# Patient Record
Sex: Male | Born: 1995 | Race: Black or African American | Hispanic: No | Marital: Single | Smoking: Never smoker
Health system: Southern US, Community
[De-identification: ages and names within clinical notes are randomized; demographics above are authoritative.]

---
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
## 2016-05-11 ENCOUNTER — Ambulatory Visit (HOSPITAL_BASED_OUTPATIENT_CLINIC_OR_DEPARTMENT_OTHER)
Admission: RE | Admit: 2016-05-11 | Discharge: 2016-05-11 | Disposition: A | Discharge status: Home / Self Care | Payer: Managed Care, Other (non HMO) | Source: Ambulatory Visit | Attending: Family Medicine | Admitting: Family Medicine

## 2016-05-11 ENCOUNTER — Ambulatory Visit (INDEPENDENT_AMBULATORY_CARE_PROVIDER_SITE_OTHER): Payer: Managed Care, Other (non HMO) | Admitting: Family Medicine

## 2016-05-11 ENCOUNTER — Encounter: Payer: Self-pay | Admitting: Family Medicine

## 2016-05-11 VITALS — BP 134/76 | HR 78 | Temp 98.3°F | Ht 73.0 in | Wt 335.8 lb

## 2016-05-11 DIAGNOSIS — X58XXXA Exposure to other specified factors, initial encounter: Secondary | ICD-10-CM | POA: Insufficient documentation

## 2016-05-11 DIAGNOSIS — Z23 Encounter for immunization: Secondary | ICD-10-CM | POA: Diagnosis not present

## 2016-05-11 DIAGNOSIS — S6991XA Unspecified injury of right wrist, hand and finger(s), initial encounter: Secondary | ICD-10-CM | POA: Insufficient documentation

## 2016-05-11 NOTE — Progress Notes (Signed)
Chief Complaint  Patient presents with  . Establish Care    Pt reports wrist pain x2 weeks, Notice wrist pain after playing basketball/ Pt reports not trying any medication or ice or heat to area for discomfort but notices the pain onbly when he puts pressure on it       New Patient Visit SUBJECTIVE: HPI: Brandon Hines is an 20 y.o.male who is being seen for establishing care.  Wrist pain R wrist pain, 2 weeks ago was playing basketball. Someone struck the thumb side of his wrist. He has been having pain since then, but only when lifting weights or carrying something. It is slightly improving, but feels weak to him. He has not been trying anything at home. Denies current pain, numbness, tingling, or bruising.   No Known Allergies  History reviewed. No pertinent past medical history. History reviewed. No pertinent surgical history. Social History   Social History  . Marital status: Single   Social History Main Topics  . Smoking status: Never Smoker  . Smokeless tobacco: Never Used  . Alcohol use No  . Drug use: No   Family History  Problem Relation Age of Onset  . Hypertension Father   . Hypertension Paternal Grandfather      Current Outpatient Prescriptions:  .  Albuterol Sulfate (PROAIR RESPICLICK) 108 (90 Base) MCG/ACT AEPB, Inhale 2 puffs into the lungs every 6 (six) hours as needed., Disp: 1 each, Rfl: 3  ROS Neuro: Denies numbness, tingling  MSK: +wrist pain   OBJECTIVE: BP 134/76 (BP Location: Right Arm, Patient Position: Sitting, Cuff Size: Large)   Pulse 78   Temp 98.3 F (36.8 C) (Oral)   Ht 6\' 1"  (1.854 m)   Wt (!) 335 lb 12.8 oz (152.3 kg)   SpO2 98%   BMI 44.30 kg/m   Constitutional: -  VS reviewed -  Well developed, well nourished, appears stated age -  No apparent distress  Psychiatric: -  Oriented to person, place, and time -  Memory intact -  Affect and mood normal -  Fluent conversation, good eye contact -  Judgment and insight age  appropriate  Cardiovascular: -  Brisk cap refill  Respiratory: -  Normal respiratory effort  Neurological:  -  Sensation intact to light touch on R hand -  No cerebellar signs  Musculoskeletal: -  No TTP -  No deformity or swelling -  5/5 strength in hand  Skin: -  No significant lesion on inspection -  Warm and dry to palpation -  No ecchymosis or erythema on R wrist   ASSESSMENT/PLAN: Wrist injury, right, initial encounter - Plan: DG Wrist Complete Right  Encounter for immunization - Plan: Flu Vaccine QUAD 36+ mos IM  XR shows no fx, dislocation or other abnormality. As it is improving and only bothers him while he performs certain movements, will have him avoid these movements. Could pre-treat with Tylenol. Stretches/exercises given for wrist strengthening. OT if he is still having issues. Patient should return in 1 year for CPE or prn. The patient voiced understanding and agreement to the plan.   Jilda Rocheicholas Paul [place], DO 05/11/16  9:22 AM

## 2016-05-11 NOTE — Progress Notes (Signed)
Pre visit review using our clinic review tool, if applicable. No additional management support is needed unless otherwise documented below in the visit note. 

## 2016-05-11 NOTE — Patient Instructions (Addendum)
These exercises may help you when beginning to rehabilitate your injury. Your symptoms may resolve with or without further involvement from your physician, physical therapist or athletic trainer. While completing these exercises, remember:   Restoring tissue flexibility helps normal motion to return to the joints. This allows healthier, less painful movement and activity.  An effective stretch should be held for at least 30 seconds.  A stretch should never be painful. You should only feel a gentle lengthening or release in the stretched tissue.  RANGE OF MOTION - Wrist Flexion, Active-Assisted  Extend your right / left elbow with your fingers pointing down.*  Gently pull the back of your hand towards you until you feel a gentle stretch on the top of your forearm.  Hold this position for 15-20 seconds. Repeat 2-3 times. Complete this exercise 1 times per day.  *If directed by your physician, physical therapist or athletic trainer, complete this stretch with your elbow bent rather than extended.  RANGE OF MOTION - Wrist Extension, Active-Assisted  Extend your right / left elbow and turn your palm upwards.*  Gently pull your palm/fingertips back so your wrist extends and your fingers point more toward the ground.  You should feel a gentle stretch on the inside of your forearm.  Hold this position for __________ seconds. Repeat __________ times. Complete this exercise __________ times per day. *If directed by your physician, physical therapist or athletic trainer, complete this stretch with your elbow bent, rather than extended.  RANGE OF MOTION - Supination, Active  Stand or sit with your elbows at your side. Bend your right / left elbow to 90 degrees.  Turn your palm upward until you feel a gentle stretch on the inside of your forearm.  Hold this position for __________ seconds. Slowly release and return to the starting position. Repeat __________ times. Complete this stretch  __________ times per day.   RANGE OF MOTION - Pronation, Active  Stand or sit with your elbows at your side. Bend your right / left elbow to 90 degrees.  Turn your palm downward until you feel a gentle stretch on the top of your forearm.  Hold this position for __________ seconds. Slowly release and return to the starting position. Repeat __________ times. Complete this stretch __________ times per day.   STRETCH - Wrist Flexion  Place the back of your right / left hand on a tabletop leaving your elbow slightly bent. Your fingers should point away from your body.  Gently press the back of your hand down onto the table by straightening your elbow. You should feel a stretch on the top of your forearm.  Hold this position for __________ seconds. Repeat __________ times. Complete this stretch __________ times per day.   STRETCH - Wrist Extension  Place your right / left fingertips on a tabletop leaving your elbow slightly bent. Your fingers should point backwards.  Gently press your fingers and palm down onto the table by straightening your elbow. You should feel a stretch on the inside of your forearm.  Hold this position for __________ seconds. Repeat __________ times. Complete this stretch __________ times per day.  STRENGTHENING EXERCISES - Wrist Fracture (Radius and Ulna) These exercises may help you when beginning to rehabilitate your injury. They may resolve your symptoms with or without further involvement from your physician, physical therapist or athletic trainer. While completing these exercises, remember:   Muscles can gain both the endurance and the strength needed for everyday activities through controlled exercises.  Complete  these exercises as instructed by your physician, physical therapist or athletic trainer. Progress with the resistance and repetition exercises only as your caregiver advises.  STRENGTH - Wrist Flexors  Sit with your right / left forearm palm-up  and fully supported. Your elbow should be resting below the height of your shoulder. Allow your wrist to extend over the edge of the surface.  Loosely holding a __________ weight or a piece of rubber exercise band/tubing, slowly curl your hand up toward your forearm.  Hold this position for __________ seconds. Slowly lower the wrist back to the starting position in a controlled manner. Repeat __________ times. Complete this exercise __________ times per day.   STRENGTH - Wrist Extensors  Sit with your right / left forearm palm-down and fully supported. Your elbow should be resting below the height of your shoulder. Allow your wrist to extend over the edge of the surface.  Loosely holding a __________ weight or a piece of rubber exercise band/tubing, slowly curl your hand up toward your forearm.  Hold this position for __________ seconds. Slowly lower the wrist back to the starting position in a controlled manner. Repeat __________ times. Complete this exercise __________ times per day.   STRENGTH - Ulnar Deviators  Stand with a ____________________ weight in your right / left hand, or sit holding on to the rubber exercise band/tubing with your opposite arm supported.  Move your wrist so that your pinkie travels toward your forearm and your thumb moves away from your forearm.  Hold this position for __________ seconds and then slowly lower the wrist back to the starting position. Repeat __________ times. Complete this exercise __________ times per day  STRENGTH - Radial Deviators  Stand with a ____________________ weight in your right / left hand, or sit holding on to the rubber exercise band/tubing with your arm supported.  Raise your hand upward in front of you or pull up on the rubber tubing.  Hold this position for __________ seconds and then slowly lower the wrist back to the starting position. Repeat __________ times. Complete this exercise __________ times per day.  STRENGTH -  Forearm Supinators   Sit with your right / left forearm supported on a table, keeping your elbow below shoulder height. Rest your hand over the edge, palm down.  Gently grip a hammer or a soup ladle.  Without moving your elbow, slowly turn your palm and hand upward to a "thumbs-up" position.  Hold this position for __________ seconds. Slowly return to the starting position. Repeat __________ times. Complete this exercise __________ times per day.   STRENGTH - Forearm Pronators   Sit with your right / left forearm supported on a table, keeping your elbow below shoulder height. Rest your hand over the edge, palm up.  Gently grip a hammer or a soup ladle.  Without moving your elbow, slowly turn your palm and hand upward to a "thumbs-up" position.  Hold this position for __________ seconds. Slowly return to the starting position. Repeat __________ times. Complete this exercise __________ times per day.   STRENGTH - Grip  Grasp a tennis ball, a dense sponge, or a large, rolled sock in your hand.  Squeeze as hard as you can without increasing any pain.  Hold this position for __________ seconds. Release your grip slowly. Repeat __________ times. Complete this exercise __________ times per day.

## 2017-07-31 ENCOUNTER — Other Ambulatory Visit: Payer: Self-pay

## 2017-07-31 ENCOUNTER — Encounter (HOSPITAL_BASED_OUTPATIENT_CLINIC_OR_DEPARTMENT_OTHER): Payer: Self-pay | Admitting: *Deleted

## 2017-07-31 ENCOUNTER — Emergency Department (HOSPITAL_BASED_OUTPATIENT_CLINIC_OR_DEPARTMENT_OTHER)
Admission: EM | Admit: 2017-07-31 | Discharge: 2017-07-31 | Disposition: A | Discharge status: Home / Self Care | Payer: Managed Care, Other (non HMO) | Attending: Emergency Medicine | Admitting: Emergency Medicine

## 2017-07-31 DIAGNOSIS — R55 Syncope and collapse: Secondary | ICD-10-CM

## 2017-07-31 LAB — COMPREHENSIVE METABOLIC PANEL
ALT: 32 U/L (ref 17–63)
AST: 35 U/L (ref 15–41)
Albumin: 4.3 g/dL (ref 3.5–5.0)
Alkaline Phosphatase: 76 U/L (ref 38–126)
Anion gap: 11 (ref 5–15)
BUN: 11 mg/dL (ref 6–20)
CHLORIDE: 105 mmol/L (ref 101–111)
CO2: 23 mmol/L (ref 22–32)
Calcium: 9.3 mg/dL (ref 8.9–10.3)
Creatinine, Ser: 1.11 mg/dL (ref 0.61–1.24)
Glucose, Bld: 105 mg/dL — ABNORMAL HIGH (ref 65–99)
POTASSIUM: 3.2 mmol/L — AB (ref 3.5–5.1)
SODIUM: 139 mmol/L (ref 135–145)
Total Bilirubin: 0.6 mg/dL (ref 0.3–1.2)
Total Protein: 7.9 g/dL (ref 6.5–8.1)

## 2017-07-31 LAB — CBC WITH DIFFERENTIAL/PLATELET
BASOS ABS: 0 10*3/uL (ref 0.0–0.1)
Basophils Relative: 0 %
EOS ABS: 0.1 10*3/uL (ref 0.0–0.7)
EOS PCT: 1 %
HCT: 43 % (ref 39.0–52.0)
Hemoglobin: 14.2 g/dL (ref 13.0–17.0)
LYMPHS ABS: 1.8 10*3/uL (ref 0.7–4.0)
LYMPHS PCT: 26 %
MCH: 26 pg (ref 26.0–34.0)
MCHC: 33 g/dL (ref 30.0–36.0)
MCV: 78.8 fL (ref 78.0–100.0)
MONO ABS: 0.7 10*3/uL (ref 0.1–1.0)
Monocytes Relative: 10 %
Neutro Abs: 4.4 10*3/uL (ref 1.7–7.7)
Neutrophils Relative %: 63 %
PLATELETS: 300 10*3/uL (ref 150–400)
RBC: 5.46 MIL/uL (ref 4.22–5.81)
RDW: 15.6 % — AB (ref 11.5–15.5)
WBC: 6.9 10*3/uL (ref 4.0–10.5)

## 2017-07-31 LAB — CBG MONITORING, ED: GLUCOSE-CAPILLARY: 127 mg/dL — AB (ref 65–99)

## 2017-07-31 MED ORDER — SODIUM CHLORIDE 0.9 % IV BOLUS (SEPSIS)
1000.0000 mL | Freq: Once | INTRAVENOUS | Status: AC
  Administered 2017-07-31: 1000 mL via INTRAVENOUS

## 2017-07-31 MED ORDER — POTASSIUM CHLORIDE CRYS ER 20 MEQ PO TBCR
40.0000 meq | EXTENDED_RELEASE_TABLET | Freq: Two times a day (BID) | ORAL | Status: DC
  Administered 2017-07-31: 40 meq via ORAL
  Filled 2017-07-31: qty 2

## 2017-07-31 NOTE — ED Notes (Signed)
Pt reports he went to gym, rode bike for about 10 mins then lifted weights. After lifting weights he felt dizzy and was sitting on a machine then "woke up on the floor". Pt only recalls one episode of syncope although ems reported bystanders saw 2. Pt states he hit back of head. No wound noted and pt rates pain 1/10. Received 500cc NS pta. Pt reports he "feels good" now.

## 2017-07-31 NOTE — ED Notes (Signed)
ED Provider at bedside. 

## 2017-07-31 NOTE — ED Provider Notes (Signed)
[facility] EMERGENCY DEPARTMENT Provider Note   CSN: [id number] Arrival date & time: 07/31/17  1739     History   Chief Complaint Chief Complaint  Patient presents with  . Loss of Consciousness    HPI Brandon Hines is a 21 y.o. male.  HPI   Was doing cardio first, then was lifting weights, felt dizzy, lightheaded. Went to go get some water and sat down, felt dizzy.  Passed out, not sure how long. Bystanders did not report seizure like activity. Out for 5-10 seconds per bystander. Hadn't had much to eat or drink today.  No nausea.  No chest pain or shortness of breath.  No fevers, no vomiting, no diarrhea.  No leg pain or swelling. No black or bloody stools.  No family hx of early death.   History reviewed. No pertinent past medical history.  There are no active problems to display for this patient.   History reviewed. No pertinent surgical history.     Home Medications    Prior to Admission medications   Medication Sig Start Date End Date Taking? Authorizing Provider  Albuterol Sulfate (PROAIR RESPICLICK) 108 (90 Base) MCG/ACT AEPB Inhale 2 puffs into the lungs every 6 (six) hours as needed. 10/08/15   Elvina SidleLauenstein, Kurt, MD    Family History Family History  Problem Relation Age of Onset  . Hypertension Father   . Hypertension Paternal Grandfather     Social History Social History   Tobacco Use  . Smoking status: Never Smoker  . Smokeless tobacco: Never Used  Substance Use Topics  . Alcohol use: No    Alcohol/week: 0.0 oz  . Drug use: No     Allergies   Patient has no known allergies.   Review of Systems Review of Systems  Constitutional: Negative for fever.  HENT: Negative for sore throat.   Eyes: Negative for visual disturbance.  Respiratory: Negative for shortness of breath.   Cardiovascular: Negative for chest pain.  Gastrointestinal: Negative for abdominal pain, nausea and vomiting.  Genitourinary: Negative for difficulty  urinating.  Musculoskeletal: Negative for back pain.  Skin: Negative for rash.  Neurological: Positive for syncope and light-headedness. Negative for seizures, facial asymmetry, weakness and headaches.     Physical Exam Updated Vital Signs BP 130/68   Pulse 84   Temp 98.6 F (37 C) (Oral)   Resp 19   Ht 6\' 1"  (1.854 m)   Wt (!) 158.8 kg (350 lb)   SpO2 100%   BMI 46.18 kg/m   Physical Exam  Constitutional: He is oriented to person, place, and time. He appears well-developed and well-nourished. No distress.  HENT:  Head: Normocephalic and atraumatic.  Eyes: Conjunctivae and EOM are normal.  Neck: Normal range of motion.  Cardiovascular: Normal rate, regular rhythm, normal heart sounds and intact distal pulses. Exam reveals no gallop and no friction rub.  No murmur heard. Pulmonary/Chest: Effort normal and breath sounds normal. No respiratory distress. He has no wheezes. He has no rales.  Abdominal: Soft. He exhibits no distension. There is no tenderness. There is no guarding.  Musculoskeletal: He exhibits no edema.  Neurological: He is alert and oriented to person, place, and time.  Skin: Skin is warm and dry. He is not diaphoretic.  Nursing note and vitals reviewed.    ED Treatments / Results  Labs (all labs ordered are listed, but only abnormal results are displayed) Labs Reviewed  CBC WITH DIFFERENTIAL/PLATELET - Abnormal; Notable for the following components:  Result Value   RDW 15.6 (*)    All other components within normal limits  COMPREHENSIVE METABOLIC PANEL - Abnormal; Notable for the following components:   Potassium 3.2 (*)    Glucose, Bld 105 (*)    All other components within normal limits  CBG MONITORING, ED - Abnormal; Notable for the following components:   Glucose-Capillary 127 (*)    All other components within normal limits    EKG  EKG Interpretation  Date/Time:  Saturday July 31 2017 17:48:27 EST Ventricular Rate:  83 PR  Interval:    QRS Duration: 103 QT Interval:  376 QTC Calculation: 442 R Axis:   115 Text Interpretation:  Sinus rhythm Right axis deviation Minimal ST depression, inferior leads No previous ECGs available Confirmed by Alvira MondaySchlossman, Kylin Dubs ([id number]) on 07/31/2017 5:51:31 PM Also confirmed by Alvira MondaySchlossman, Alania Overholt ([id number]), editor Barbette Hairassel, Kerry [phone]([id number])  on 08/01/2017 8:10:45 AM       Radiology No results found.  Procedures Procedures (including critical care time)  Medications Ordered in ED Medications  sodium chloride 0.9 % bolus 1,000 mL (0 mLs Intravenous Stopped 07/31/17 1846)     Initial Impression / Assessment and Plan / ED Course  I have reviewed the triage vital signs and the nursing notes.  Pertinent labs & imaging results that were available during my care of the patient were reviewed by me and considered in my medical decision making (see chart for details).     21 yo male presents with concern for syncope after a workout this morning.  Reports he did not have much to eat or drink prior to workout.  Did not have chest pain, shortness of breath, palpitations, and have low suspicion for ACS, pulmonary embolus, dissection.  EKG evaluated by me and shows sinus rhythm with no sign of prolonged QTc, no delta waves, no brugada, no sign of HOCM, no sig ST abnormalities.  Labs show no significant abnormalities.  Mild hypokalemia replaced with potassium.  Suspect patient most likely with dehydration leading to episode of lightheadedness, however recommend close primary care physician follow-up. Patient discharged in stable condition with understanding of reasons to return.   Final Clinical Impressions(s) / ED Diagnoses   Final diagnoses:  Syncope, unspecified syncope type    ED Discharge Orders    None       Alvira MondaySchlossman, Xiana Carns, MD 08/01/17 1404

## 2017-07-31 NOTE — ED Notes (Signed)
Pt able to slide unassisted from equal height stretcher to bed. In NAD. Placed on auto VS and cardiac monitor.

## 2017-07-31 NOTE — ED Notes (Signed)
Pt ambulatory to d/c window with steady gait. Family with pt

## 2017-07-31 NOTE — ED Triage Notes (Addendum)
[facility] Per [facility] report: Pt had 2 witnessed syncopal episodes after short session of cardio. PIV started by EMS and 500cc bolus given pta. Bp 107/78 HR 98 O2 sats 98% ra RR 20. CBG 117. Pt cao x4. Denies injury from fall

## 2017-09-04 IMAGING — CR DG CHEST 2V
2 series · 2 of 2 positions shown · non-contrast
Comparison: No prior .

CLINICAL DATA: Right chest pain.

EXAM:
CHEST  2 VIEW

[PA]
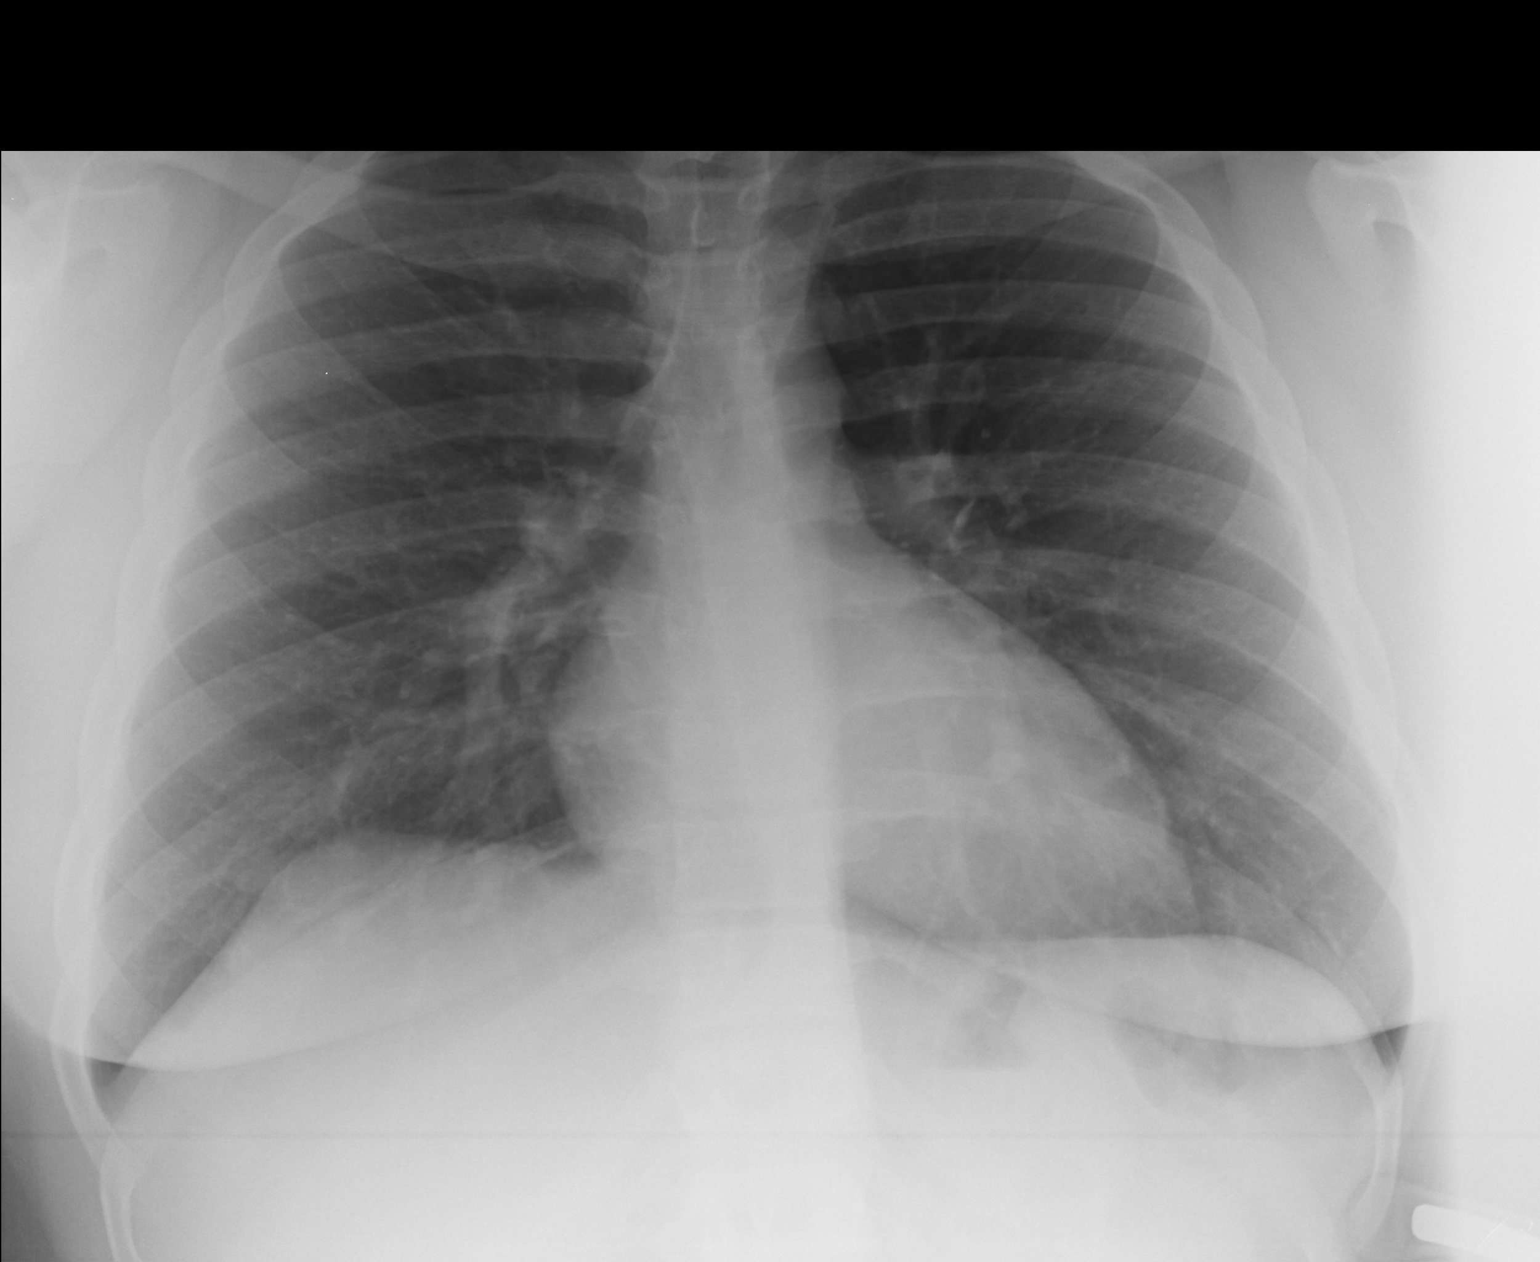

[lateral]
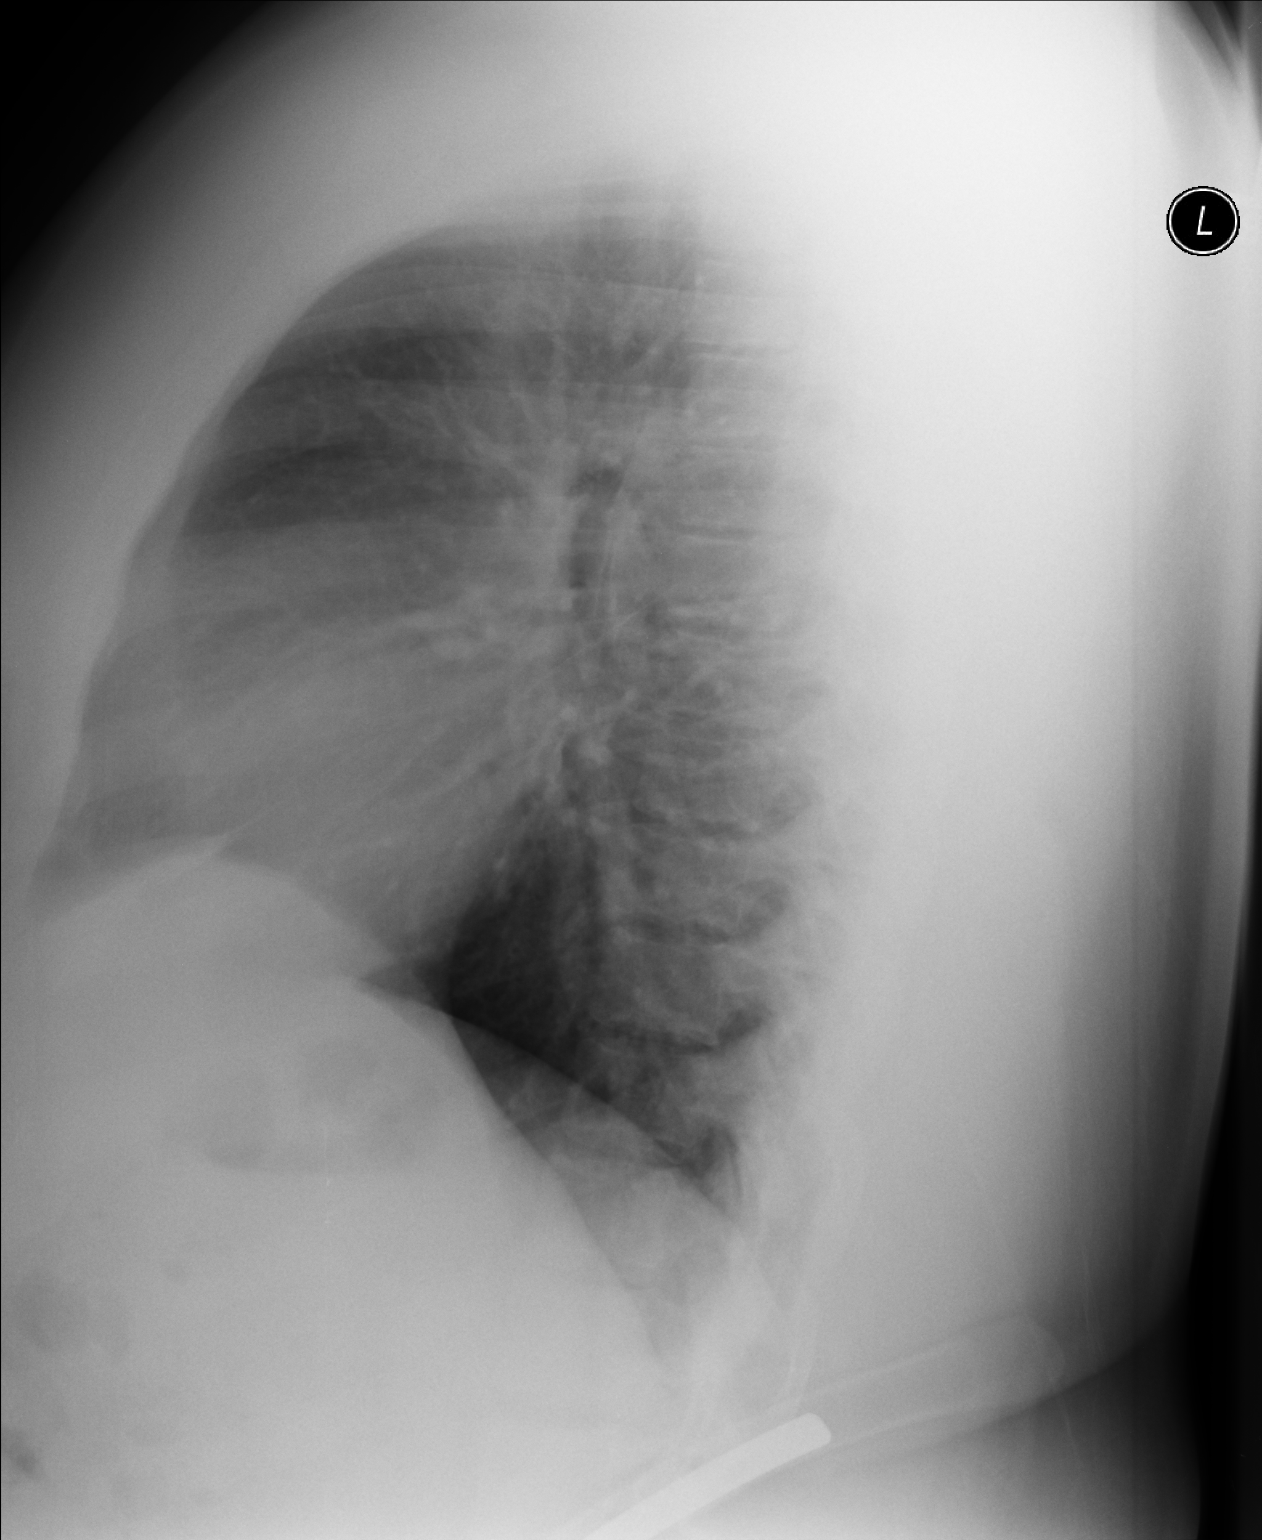

[2 of 2 positions shown; findings below may reference images not displayed]

FINDINGS: Mediastinum and hilar structures normal. Low lung volumes with mild
basilar atelectasis. Heart size normal. No pleural effusion or
pneumothorax
IMPRESSION: Low lung volumes with mild basilar atelectasis.

## 2021-05-28 ENCOUNTER — Other Ambulatory Visit: Payer: Self-pay

## 2021-05-28 ENCOUNTER — Ambulatory Visit (HOSPITAL_COMMUNITY)
Admission: EM | Admit: 2021-05-28 | Discharge: 2021-05-28 | Disposition: A | Discharge status: Home / Self Care | Payer: BC Managed Care – PPO

## 2021-05-28 ENCOUNTER — Encounter (HOSPITAL_COMMUNITY): Payer: Self-pay

## 2021-05-28 DIAGNOSIS — M79672 Pain in left foot: Secondary | ICD-10-CM

## 2021-05-28 NOTE — ED Provider Notes (Signed)
[facility]    CSN: [id number] Arrival date & time: 05/28/21  1008      History   Chief Complaint Chief Complaint  Patient presents with   Foot Pain    HPI Brandon Hines is a 25 y.o. male.   Left Heel Pain Started 3 days ago No injury Has been playing basketball more to get in shape Hurts more when standing, but two days ago also hurt at rest No swelling or bruising Sister had a boot from prior injury so wore it yesterday, but it didn't help Hasn't taken anything for the pain Hasn't had heel pain before    History reviewed. No pertinent past medical history.  There are no problems to display for this patient.   History reviewed. No pertinent surgical history.     Home Medications    Prior to Admission medications   Medication Sig Start Date End Date Taking? Authorizing Provider  Albuterol Sulfate (PROAIR RESPICLICK) 108 (90 Base) MCG/ACT AEPB Inhale 2 puffs into the lungs every 6 (six) hours as needed. 10/08/15   Elvina Sidle, MD    Family History Family History  Problem Relation Age of Onset   Hypertension Father    Hypertension Paternal Grandfather     Social History Social History   Tobacco Use   Smoking status: Never   Smokeless tobacco: Never  Vaping Use   Vaping Use: Never used  Substance Use Topics   Alcohol use: No    Alcohol/week: 0.0 standard drinks   Drug use: No     Allergies   Patient has no known allergies.   Review of Systems Review of Systems  All other systems reviewed and are negative. Per HPI  Physical Exam Triage Vital Signs ED Triage Vitals [05/28/21 1135]  Enc Vitals Group     BP      Pulse      Resp      Temp      Temp src      SpO2      Weight      Height      Head Circumference      Peak Flow      Pain Score 8     Pain Loc      Pain Edu?      Excl. in GC?    No data found.  Updated Vital Signs BP (!) 147/77 (BP Location: Right Arm)   Pulse 75   Temp 98.9 F (37.2 C)  (Oral)   Resp 19   SpO2 99%   Visual Acuity Right Eye Distance:   Left Eye Distance:   Bilateral Distance:    Right Eye Near:   Left Eye Near:    Bilateral Near:     Physical Exam Constitutional:      General: He is not in acute distress.    Appearance: Normal appearance. He is not ill-appearing or toxic-appearing.  Cardiovascular:     Rate and Rhythm: Normal rate.  Pulmonary:     Effort: Pulmonary effort is normal.  Musculoskeletal:     Comments: Left Ankle/Heel: - Inspection: No obvious deformity, erythema, swelling, or ecchymosis, ulcers, calluses, blisters b/l.  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 - Palpation: No TTP at MT heads, no TTP at base of 5th MT, no TTP over cuboid, no tenderness over navicular prominence, no TTP over lateral or medial malleolus.  No sign of peroneal tendon subluxation or TTP.  There is TTP at insertion of achilles  tendon on calcaneus on left. - Strength: Normal strength with dorsiflexion, plantarflexion, inversion, and eversion of foot; flexion and extension of toes b/l - ROM: Full ROM b/l - Neuro/vasc: NV intact distally bilaterally - Special Tests: Negative calcaneal squeeze, negative hop test, negative Thompson      Neurological:     Mental Status: He is alert.     UC Treatments / Results  Labs (all labs ordered are listed, but only abnormal results are displayed) Labs Reviewed - No data to display  EKG   Radiology No results found.  Procedures Procedures (including critical care time)  Medications Ordered in UC Medications - No data to display  Initial Impression / Assessment and Plan / UC Course  I have reviewed the triage vital signs and the nursing notes.  Pertinent labs & imaging results that were available during my care of the patient were reviewed by me and considered in my medical decision making (see chart for details).     Symptoms and exam consistent with insertional Achilles tendinitis.  Recommend heel lift,  ibuprofen, arch support.  Also advised follow-up with sports medicine if not improving over the next 1 to 2 weeks.  No signs or symptoms of rupture on exam. Final Clinical Impressions(s) / UC Diagnoses   Final diagnoses:  Pain of left heel     Discharge Instructions      As we discussed, I think that you have Achilles tendinitis.  You do not have any signs or symptoms of fracture on your exam which is reassuring.  Your tendon also seems to be intact on exam.  You can take ibuprofen up to 800 mg 3 times a day as needed.  Do not take this for longer than a few days scheduled.  You can follow-up at the sports medicine office if you are not improving over the next 1 to 2 weeks.  I recommend trying to find a heel lift for your Achilles pain to help with this.  Try to rest over the next week to get this pain to improve.     ED Prescriptions   None    PDMP not reviewed this encounter.   Unknown Jim, DO 05/28/21 1156

## 2021-05-28 NOTE — Discharge Instructions (Addendum)
As we discussed, I think that you have Achilles tendinitis.  You do not have any signs or symptoms of fracture on your exam which is reassuring.  Your tendon also seems to be intact on exam.  You can take ibuprofen up to 800 mg 3 times a day as needed.  Do not take this for longer than a few days scheduled.  You can follow-up at the sports medicine office if you are not improving over the next 1 to 2 weeks.  I recommend trying to find a heel lift for your Achilles pain to help with this.  Try to rest over the next week to get this pain to improve.

## 2021-05-28 NOTE — ED Triage Notes (Signed)
Pt presents with L foot heel pain.

## 2023-10-24 ENCOUNTER — Emergency Department (HOSPITAL_BASED_OUTPATIENT_CLINIC_OR_DEPARTMENT_OTHER)
Admission: EM | Admit: 2023-10-24 | Discharge: 2023-10-24 | Disposition: A | Discharge status: Home / Self Care | Attending: Emergency Medicine | Admitting: Emergency Medicine

## 2023-10-24 ENCOUNTER — Other Ambulatory Visit: Payer: Self-pay

## 2023-10-24 ENCOUNTER — Emergency Department (HOSPITAL_BASED_OUTPATIENT_CLINIC_OR_DEPARTMENT_OTHER): Admitting: Radiology

## 2023-10-24 ENCOUNTER — Emergency Department (HOSPITAL_BASED_OUTPATIENT_CLINIC_OR_DEPARTMENT_OTHER)

## 2023-10-24 DIAGNOSIS — W01198A Fall on same level from slipping, tripping and stumbling with subsequent striking against other object, initial encounter: Secondary | ICD-10-CM | POA: Diagnosis not present

## 2023-10-24 DIAGNOSIS — M25562 Pain in left knee: Secondary | ICD-10-CM | POA: Diagnosis present

## 2023-10-24 DIAGNOSIS — Y9367 Activity, basketball: Secondary | ICD-10-CM | POA: Diagnosis not present

## 2023-10-24 MED ORDER — IBUPROFEN 200 MG PO TABS
600.0000 mg | ORAL_TABLET | Freq: Once | ORAL | Status: AC
  Administered 2023-10-24: 600 mg via ORAL
  Filled 2023-10-24: qty 1

## 2023-10-24 MED ORDER — IBUPROFEN 600 MG PO TABS: 600.0000 mg | ORAL_TABLET | Freq: Four times a day (QID) | ORAL | 0 refills | Status: AC | PRN

## 2023-10-24 NOTE — ED Provider Notes (Signed)
 [place] EMERGENCY DEPARTMENT AT [facility] Provider Note   CSN: [id number] Arrival date & time: 10/24/23  1358     History  Chief Complaint  Patient presents with   Knee Injury    Left    Brandon Hines is a 28 y.o. male.  HPI   28 year old male presents emergency department with complaints of left knee pain.  States that he went to push off of his left leg to the right to chase the basketball when he felt a popping sensation and fell to the ground.  Has been unable to bend legs since then due to pain.  Denies trauma elsewhere.  Patient does states he has a history of MCL tear but did not have surgery in 2014.  No significant pertinent past medical history.  Home Medications Prior to Admission medications   Medication Sig Start Date End Date Taking? Authorizing Provider  ibuprofen (ADVIL) 600 MG tablet Take 1 tablet (600 mg total) by mouth every 6 (six) hours as needed. 10/24/23  Yes Sherian Maroon A, PA  Albuterol Sulfate (PROAIR RESPICLICK) 108 (90 Base) MCG/ACT AEPB Inhale 2 puffs into the lungs every 6 (six) hours as needed. 10/08/15   Elvina Sidle, MD      Allergies    Patient has no known allergies.    Review of Systems   Review of Systems  All other systems reviewed and are negative.   Physical Exam Updated Vital Signs BP (!) 109/56   Pulse 82   Temp 98.4 F (36.9 C) (Oral)   Resp 18   Wt (!) 149.7 kg   SpO2 97%   BMI 43.54 kg/m  Physical Exam Vitals and nursing note reviewed.  Constitutional:      General: He is not in acute distress.    Appearance: He is well-developed.  HENT:     Head: Normocephalic and atraumatic.  Eyes:     Conjunctiva/sclera: Conjunctivae normal.  Cardiovascular:     Rate and Rhythm: Normal rate and regular rhythm.     Heart sounds: No murmur heard. Pulmonary:     Effort: Pulmonary effort is normal. No respiratory distress.     Breath sounds: Normal breath sounds. No wheezing, rhonchi or rales.   Abdominal:     Palpations: Abdomen is soft.     Tenderness: There is no abdominal tenderness.  Musculoskeletal:        General: No swelling.     Cervical back: Neck supple.     Comments: Left patella seems to be high riding compared to right.  Patient able to flex and extend knee but with pain.  Medial joint line tenderness.  Pedal pulses 2+ bilaterally.  No overlying erythema compartment flexion/induration.  Skin:    General: Skin is warm and dry.     Capillary Refill: Capillary refill takes less than 2 seconds.  Neurological:     Mental Status: He is alert.  Psychiatric:        Mood and Affect: Mood normal.     ED Results / Procedures / Treatments   Labs (all labs ordered are listed, but only abnormal results are displayed) Labs Reviewed - No data to display  EKG None  Radiology DG Knee Complete 4 Views Right Result Date: 10/24/2023 CLINICAL DATA:  Bilateral knee pain, basketball injury EXAM: RIGHT KNEE - COMPLETE 4+ VIEW COMPARISON:  09/16/2009 FINDINGS: No evidence of fracture, dislocation, or joint effusion. No evidence of arthropathy or other focal bone abnormality. Soft tissues are unremarkable.  Insall-Salvati ratio 1.1, which is normal. IMPRESSION: 1. No significant radiographic abnormality. Electronically Signed   By: Gaylyn Rong M.D.   On: 10/24/2023 15:39   DG Knee Complete 4 Views Left Result Date: 10/24/2023 CLINICAL DATA:  Basketball injury, bilateral knee pain EXAM: LEFT KNEE - COMPLETE 4+ VIEW COMPARISON:  None Available. 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 No knee effusion. 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 Mild marginal spurring inferiorly along the patella. No fracture or knee effusion identified. IMPRESSION: 1. 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 Cannot exclude injury or disruption of the patellar tendon. 3. Mild marginal spurring inferiorly along the patella. Electronically Signed   By: Gaylyn Rong M.D.   On: 10/24/2023 15:36    Procedures Procedures    Medications Ordered in ED Medications  ibuprofen (ADVIL) tablet 600 mg (has no administration in time range)    ED Course/ Medical Decision Making/ A&P                                 Medical Decision Making Amount and/or Complexity of Data Reviewed Radiology: ordered.   This patient presents to the ED for concern of knee pain, this involves an extensive number of treatment options, and is a complaint that carries with it a high risk of complications and morbidity.  The differential diagnosis includes fracture, strain/pain, dislocation, ligament/tendon injury, neurovascular compromise, other   Co morbidities that complicate the patient evaluation  See HPI   Additional history obtained:  Additional history obtained from EMR External records from outside source obtained and reviewed including hospital records   Lab Tests:  N/a   Imaging Studies ordered:  I ordered imaging studies including knee x-ray I independently visualized and interpreted imaging which showed some early calcification along the proximal margin of the femoral condyle medially.  Patella alta. I agree with the radiologist interpretation  Cardiac Monitoring: / EKG:  The patient was maintained on a cardiac monitor.  I personally viewed and interpreted the cardiac monitored which showed an underlying rhythm of: Sinus rhythm   Consultations Obtained:  N/a   Problem List / ED Course / Critical interventions / Medication management  Left knee pain I ordered medication including motrin    Reevaluation of the patient after these medicines showed that the patient improved I have reviewed the patients home medicines and have made adjustments as needed   Social  Determinants of Health:  Denies tobacco, licit drug use.   Test / Admission - Considered:  Left knee pain Vitals signs within normal range and stable throughout visit. Imaging studies significant for: See above 28 year old male presents emergency department with complaints of left knee pain.  States that he went to push off of his left leg to the right to chase the basketball when he felt a popping sensation and fell to the ground.  Has been unable to bend legs since then due to pain.  Denies trauma elsewhere.  Patient does states he has a history of MCL tear but did not have surgery in 2014. On exam, tender to palpation over patellar ligament with some medial joint line tenderness.  Decreased range of motion in knee flexion with high riding patella.  X-ray obtained which was negative for any acute osseous abnormality but with patella alta and  other findings as above.  Concern for patellar ligament injury.  Patient placed in knee immobilizer given crutches to help aid in ambulation.  Will recommend rest, ice, ovation, NSAIDs and follow-up with orthopedics in the outpatient setting.  Treatment plan discussed at length with patient and he acknowledged understanding was agreeable to said plan.  Patient overall well-appearing, afebrile in no acute distress. Worrisome signs and symptoms were discussed with the patient, and the patient acknowledged understanding to return to the ED if noticed. Patient was stable upon discharge.          Final Clinical Impression(s) / ED Diagnoses Final diagnoses:  Acute pain of left knee    Rx / DC Orders ED Discharge Orders          Ordered    ibuprofen (ADVIL) 600 MG tablet  Every 6 hours PRN        10/24/23 1544    Ambulatory referral to Orthopedic Surgery       Comments: Patella tendon injury   10/24/23 1545              Peter Garter, [place] 10/24/23 1613    Ernie Avena, MD 10/25/23 [phone]

## 2023-10-24 NOTE — ED Triage Notes (Signed)
 Pt reports playing basketball today around 1315 stated he was jumping up and left knee gave out. He hit the floor and was unable to bend or bare weight on knee.  Pt reports in 2014 playing foot ball and tore MCL, did not have surgery and injury repaired on its own.

## 2023-10-24 NOTE — Discharge Instructions (Addendum)
 As discussed, concern for ligament injury as cause of your symptoms.  Wear knee immobilizer and use crutches to help aid getting around until follow-up with orthopedics.  You may take ibuprofen for pain.  Will send in a referral for Dr. Thad Ranger.  Will also attach his number to your discharge papers so you have his information.  Recommend rest, ice, elevating leg above your heart to help with swelling/pain.  Please do not hesitate to return if the worrisome signs and symptoms we discussed become apparent.

## 2023-11-03 ENCOUNTER — Other Ambulatory Visit: Payer: Self-pay | Admitting: Sports Medicine

## 2023-11-03 DIAGNOSIS — S86812A Strain of other muscle(s) and tendon(s) at lower leg level, left leg, initial encounter: Secondary | ICD-10-CM

## 2023-11-09 ENCOUNTER — Encounter: Payer: Self-pay | Admitting: Sports Medicine

## 2023-11-10 ENCOUNTER — Encounter: Payer: Self-pay | Admitting: Sports Medicine

## 2023-11-11 ENCOUNTER — Ambulatory Visit
Admission: RE | Admit: 2023-11-11 | Discharge: 2023-11-11 | Disposition: A | Discharge status: Home / Self Care | Source: Ambulatory Visit | Attending: Sports Medicine | Admitting: Sports Medicine

## 2023-11-11 DIAGNOSIS — S86812A Strain of other muscle(s) and tendon(s) at lower leg level, left leg, initial encounter: Secondary | ICD-10-CM
